# Patient Record
Sex: Female | Born: 1965 | Hispanic: Yes | Marital: Married | Smoking: Never smoker
Health system: Southern US, Community
[De-identification: ages and names within clinical notes are randomized; demographics above are authoritative.]

## PROBLEM LIST (undated history)

## (undated) DIAGNOSIS — R42 Dizziness and giddiness: Secondary | ICD-10-CM

## (undated) DIAGNOSIS — E785 Hyperlipidemia, unspecified: Secondary | ICD-10-CM

## (undated) DIAGNOSIS — G5603 Carpal tunnel syndrome, bilateral upper limbs: Secondary | ICD-10-CM

## (undated) DIAGNOSIS — R739 Hyperglycemia, unspecified: Secondary | ICD-10-CM

## (undated) HISTORY — PX: BREAST EXCISIONAL BIOPSY: SUR124

## (undated) HISTORY — DX: Carpal tunnel syndrome, bilateral upper limbs: G56.03

## (undated) HISTORY — PX: ABDOMINAL HYSTERECTOMY: SHX81

## (undated) HISTORY — DX: Dizziness and giddiness: R42

## (undated) HISTORY — DX: Hyperglycemia, unspecified: R73.9

## (undated) HISTORY — DX: Hyperlipidemia, unspecified: E78.5

## (undated) HISTORY — PX: BREAST CYST EXCISION: SHX579

---

## 2018-02-10 ENCOUNTER — Other Ambulatory Visit: Payer: Self-pay

## 2018-02-14 ENCOUNTER — Other Ambulatory Visit (HOSPITAL_COMMUNITY): Payer: Self-pay | Admitting: *Deleted

## 2018-02-14 DIAGNOSIS — Z1231 Encounter for screening mammogram for malignant neoplasm of breast: Secondary | ICD-10-CM

## 2018-02-17 ENCOUNTER — Other Ambulatory Visit (HOSPITAL_COMMUNITY): Payer: Self-pay | Admitting: *Deleted

## 2018-02-17 DIAGNOSIS — Z0000 Encounter for general adult medical examination without abnormal findings: Secondary | ICD-10-CM

## 2018-02-17 LAB — CYTOLOGY - PAP: Diagnosis: NEGATIVE

## 2018-02-20 NOTE — Addendum Note (Signed)
Addended by: Donia Guiles on: 02/20/2018 10:34 AM   Modules accepted: Orders

## 2018-02-20 NOTE — Addendum Note (Signed)
Addended by: Kristin Lamagna on: 02/20/2018 10:34 AM   Modules accepted: Orders  

## 2018-02-21 ENCOUNTER — Inpatient Hospital Stay: Payer: Self-pay

## 2018-02-21 ENCOUNTER — Inpatient Hospital Stay: Payer: Self-pay | Attending: Obstetrics and Gynecology | Admitting: *Deleted

## 2018-02-21 VITALS — BP 140/74 | Ht 63.0 in | Wt 196.0 lb

## 2018-02-21 DIAGNOSIS — Z0000 Encounter for general adult medical examination without abnormal findings: Secondary | ICD-10-CM | POA: Insufficient documentation

## 2018-02-21 LAB — LIPID PANEL
Cholesterol: 224 mg/dL — ABNORMAL HIGH (ref 0–200)
HDL: 40 mg/dL — ABNORMAL LOW (ref >40)
LDL CALC: 159 mg/dL — AB (ref 0–99)
Total CHOL/HDL Ratio: 5.6 RATIO
Triglycerides: 123 mg/dL (ref <150)
VLDL: 25 mg/dL (ref 0–40)

## 2018-02-21 LAB — HEMOGLOBIN A1C
HEMOGLOBIN A1C: 5.7 % — AB (ref 4.8–5.6)
MEAN PLASMA GLUCOSE: 116.89 mg/dL

## 2018-02-21 NOTE — Progress Notes (Signed)
Wisewoman initial screening  Spanish interpreter- Delorise Royals  Clinical Measurement:  Height: 63in   Weight: 196lb  Blood Pressure: 138/76  Blood Pressure #2: 140/74   Fasting Labs Drawn Today, will review with patient when they result.  Medical History:  Patient states that she has not been diagnosed with high  blood pressure, diabetes or heart disease.  Patient states she has been diagnosed with high cholesterol.  Medications:  Patients states she is not taking any medications for high cholesterol, high blood pressure or diabetes.  She is not taking aspirin daily to prevent heart attack or stroke.    Blood pressure, self measurement:  Patients states she does not measure blood pressure at home.    Nutrition:  Patient states she eats 0.5 cup of fruit and 0.5 cup of vegetables in an average day.  Patient states she does not eat fish regularly, she eats more less than half a serving of whole grains daily. She drinks less than 36 ounces of beverages with added sugar weekly.  She is currently watching her sodium intake.  She has not had any drinks containing alcohol in the last seven days.    Physical activity:  Patient states that she gets 210 minutes of moderate exercise in a week.  She gets 0 minutes of vigorous exercise per week.    Smoking status:  Patient states she has never smoked and is not around any smokers.    Quality of life:  Patient states that she has had 0 bad physical days out of the last 30 days. In the last 2 weeks, she has had 0 days that she has felt down or depressed. She has had 0 days in the last 2 weeks that she has had little interest or pleasure in doing things.  Risk reduction and counseling:  Patient states she wants to lose weight and increase fruit and vegetable intake.  I encouraged her to continue with current exercise regimen and increase vegetable and fruit intake.  Navigation:  I will notify patient of lab results.  Patient is aware of 2 more health coaching  sessions and a follow up.

## 2018-02-25 ENCOUNTER — Ambulatory Visit: Payer: Self-pay

## 2018-02-26 ENCOUNTER — Telehealth (HOSPITAL_COMMUNITY): Payer: Self-pay | Admitting: *Deleted

## 2018-02-26 NOTE — Telephone Encounter (Signed)
Health coaching 2  Spanish Interpreter- Delorise Royals  Labs-cholesterol 224, HDL cholesterol 40, LDL cholesterol 159,triglycerides 123, hemoglobin A1C 5.7, mean plasma 116.89  Patient is aware and understands these lab results.  Goals- Patient states she only eats 0.5 cups each of fruit/vegetables.  I encouraged her to increase consumption of fruits and vegetables to 5 per day. I also encouraged patientt to eat heart healthy fish 2 times per week.  Patient states she exercises 210 minutes weekly.  I encouraged patient to continue this regimen.   Navigation:  Patient is aware of 1 more health coaching sessions and a follow up.  Patient has an appointment at [facility] on November 1 at [facility] for dizziness episodes.  Time- 10 minutes

## 2018-02-28 ENCOUNTER — Ambulatory Visit: Payer: Self-pay

## 2018-02-28 ENCOUNTER — Encounter: Payer: Self-pay | Admitting: General Practice

## 2018-03-26 ENCOUNTER — Telehealth (HOSPITAL_COMMUNITY): Payer: Self-pay | Admitting: *Deleted

## 2018-03-26 NOTE — Telephone Encounter (Signed)
Health Coaching 3   Spanish interpreter- Consuello ClossLucia De Ratmiroff, Language resources   Goals- Patient states that she is eating more fruits and vegetables, at least 1 cup each daily.  Patient states that she is still walking  210 minutes weekly.  I encouraged patient to continue with her exercise and lifestyle changes.    Barrier to reaching goal- N/A  Strategies to overcome barriers- Na/  Navigation: Patient is aware of  a follow up.

## 2018-05-15 ENCOUNTER — Ambulatory Visit
Admission: RE | Admit: 2018-05-15 | Discharge: 2018-05-15 | Disposition: A | Discharge status: Home / Self Care | Payer: No Typology Code available for payment source | Source: Ambulatory Visit | Attending: Obstetrics and Gynecology | Admitting: Obstetrics and Gynecology

## 2018-05-15 ENCOUNTER — Ambulatory Visit (HOSPITAL_COMMUNITY)
Admission: RE | Admit: 2018-05-15 | Discharge: 2018-05-15 | Disposition: A | Discharge status: Home / Self Care | Payer: Self-pay | Source: Ambulatory Visit | Attending: Obstetrics and Gynecology | Admitting: Obstetrics and Gynecology

## 2018-05-15 ENCOUNTER — Encounter (HOSPITAL_COMMUNITY): Payer: Self-pay

## 2018-05-15 ENCOUNTER — Encounter (HOSPITAL_COMMUNITY): Payer: Self-pay | Admitting: *Deleted

## 2018-05-15 VITALS — BP 124/80 | Wt 202.0 lb

## 2018-05-15 DIAGNOSIS — Z1231 Encounter for screening mammogram for malignant neoplasm of breast: Secondary | ICD-10-CM

## 2018-05-15 DIAGNOSIS — Z1239 Encounter for other screening for malignant neoplasm of breast: Secondary | ICD-10-CM

## 2018-05-15 NOTE — Patient Instructions (Signed)
Explained breast self awareness with Davon Maroquin Cavazos. Patient did not need a Pap smear today due to last Pap smear was 02/10/2018 and patient has a history of a hysterectomy for benign reasons. Let patient know that she doesn't need any further Pap smears due to her history of a hysterectomy for benign reasons. Referred patient to the [facility] for a screening mammogram. Appointment scheduled for Thursday, May 15, 2018 at 1240. Patient aware of appointment and will be there. Let patient know the Breast Center will follow up with her within the next couple weeks with results of mammogram by letter or phone. Angelena Maroquin Cavazos verbalized understanding.  Rameen Quinney, Kathaleen Maser, RN 11:54 AM

## 2018-05-15 NOTE — Progress Notes (Signed)
Complaints of bilateral diffuse breast pain x 2-3 years that happens 2-3 times per month. No complaints of breast pain today.   Pap Smear: Pap smear not completed today. Last Pap smear was 02/10/2018 at [facility] and normal. Per patient has no history of an abnormal Pap smear. Patient has a history of a hysterectomy 20 years ago due to AUB. Patient doesn't need any further Pap smears due to her history of a hysterectomy for benign reasons per BCCCP and ACOG guidelines. Last Pap smear result is in Epic.  Physical exam: Breasts Right breast is slightly larger than the left breast that per patient is normal for her. No skin abnormalities bilateral breasts. No nipple retraction bilateral breasts. No nipple discharge bilateral breasts. No lymphadenopathy. No lumps palpated bilateral breasts. No complaints of pain or tenderness on exam. [facility] mamm[facility]. Appointment scheduled for Thursday, May 15, 2018 at 1240.        Pelvic/Bimanual No Pap smear completed today since last Pap smear was 02/10/2018 and patient has a history of a hysterectomy for benign reasons. Pap smear not indicated per BCCCP guidelines.   Smoking History: Patient has never smoked.  Patient Navigation: Patient education provided. Access to services provided for patient through [facility] program. Spanish interpreter provided.   Colorectal Cancer Screening: Per patient has never had a colonoscopy completed. No complaints today. FIT Test given to patient to complete and return to BCCCP.  Breast and Cervical Cancer Risk Assessment: Patient has no family history of breast cancer, known genetic mutations, or radiation treatment to the chest before age 930. Patient has no history of cervical dysplasia, immunocompromised, or DES exposure in-utero.  Risk Assessment    Risk Scores      05/15/2018   Last edited by: Lynnell DikeHolland,  Sabrina H, LPN   5-year risk: 0.7 %   Lifetime risk: 6.7 %          Used Spanish interpreter Natale LayErika McReynolds from [facility].

## 2018-05-19 ENCOUNTER — Other Ambulatory Visit: Payer: Self-pay | Admitting: Obstetrics and Gynecology

## 2018-05-19 DIAGNOSIS — R928 Other abnormal and inconclusive findings on diagnostic imaging of breast: Secondary | ICD-10-CM

## 2018-05-21 ENCOUNTER — Other Ambulatory Visit: Payer: Self-pay

## 2018-05-21 ENCOUNTER — Ambulatory Visit: Payer: No Typology Code available for payment source

## 2018-05-21 ENCOUNTER — Ambulatory Visit
Admission: RE | Admit: 2018-05-21 | Discharge: 2018-05-21 | Disposition: A | Discharge status: Home / Self Care | Payer: No Typology Code available for payment source | Source: Ambulatory Visit | Attending: Obstetrics and Gynecology | Admitting: Obstetrics and Gynecology

## 2018-05-21 ENCOUNTER — Other Ambulatory Visit: Payer: Self-pay | Admitting: Obstetrics and Gynecology

## 2018-05-21 DIAGNOSIS — R928 Other abnormal and inconclusive findings on diagnostic imaging of breast: Secondary | ICD-10-CM

## 2018-05-21 DIAGNOSIS — R921 Mammographic calcification found on diagnostic imaging of breast: Secondary | ICD-10-CM

## 2018-05-28 ENCOUNTER — Encounter (HOSPITAL_COMMUNITY): Payer: Self-pay

## 2018-05-28 LAB — FECAL OCCULT BLOOD, IMMUNOCHEMICAL: Fecal Occult Bld: NEGATIVE

## 2018-11-10 ENCOUNTER — Inpatient Hospital Stay: Payer: Self-pay | Attending: Obstetrics and Gynecology | Admitting: *Deleted

## 2018-11-10 ENCOUNTER — Other Ambulatory Visit: Payer: Self-pay

## 2018-11-10 VITALS — BP 132/86 | Temp 97.1°F | Ht 62.25 in | Wt 198.0 lb

## 2018-11-10 DIAGNOSIS — Z0000 Encounter for general adult medical examination without abnormal findings: Secondary | ICD-10-CM

## 2018-11-10 NOTE — Progress Notes (Signed)
Wisewoman follow up  Interpreter: Rudene Anda, [employer]   Clinical Measurement:  Height: 62 1/4 in Weight: 198 lb  Blood Pressure: 138/84  Blood Pressure #2: 132/86    Medical History:  Patient states that she has high cholesterol. Patient states that she has no history of high blood pressure or diabetes.   Medications: Patient states that she is not currently taking any medications to lower cholesterol, blood pressure or blood sugar. Patient does not take an aspirin daily to prevent heart attack or stroke.  Blood pressure, self measurement:  Patient states that a doctor has never told her to measure blood pressure from home.  Nutrition: Patient states that on an average day she consumes 2 cups of fruit and 1 cup of vegetables. Patient states that she does not eat fish at least 2 times a week. Patient states that less than half of all the servings of grain products consumed on a typical day are whole grains. She drinks less than 36 ounces of beverages with added sugars weekly and is not currently watching or reducing sodium or salt intake. In the past 7 days patient has not had any drinks containing alcohol and on an average does not consume any drinks containing alcohol.    Physical activity: Patient states that she gets 140 minutes of moderate physical activity and 0 minutes of vigorous physical activity in a week.  Smoking status:  Patient states that she quit smoking tobacco over 12 months.    Quality of life:  Over the past 2 weeks patient states that she has not had any days where she has had little interest or pleasure in doing things and no days where she is feeling down, depressed or hopeless.    Health Coaching: Educated patient about diet and exercise. Encouraged patient to try and consume and extra 2 servings of vegetables a day to get the recommended 3 servings. Also spoke with patient about adding 2 servings of fish per week into diet. Emphasized that salmon, mackerel, and tuna  were good heart healthy choices. Also spoke with the patient about reducing the amounts of fried foods consumed and suggested the alternative of grilling or baking. Patient is also not currently watching the amount of salt consumed, educated the patient about reducing or eliminating salt in her diet to help with blood pressure. Also used food models to show patient different alternatives to consume more whole grains in diet. Encouraged patient to participate in re-screening in October since some of her initial labs were elevated as well as blood pressure.   Navigation: This was the  follow up session for this patient, I will check up on her progress in the coming months.  Time: 30 minutes

## 2018-11-20 ENCOUNTER — Ambulatory Visit
Admission: RE | Admit: 2018-11-20 | Discharge: 2018-11-20 | Disposition: A | Discharge status: Home / Self Care | Payer: No Typology Code available for payment source | Source: Ambulatory Visit | Attending: Obstetrics and Gynecology | Admitting: Obstetrics and Gynecology

## 2018-11-20 ENCOUNTER — Other Ambulatory Visit: Payer: Self-pay | Admitting: Obstetrics and Gynecology

## 2018-11-20 ENCOUNTER — Other Ambulatory Visit: Payer: Self-pay

## 2018-11-20 DIAGNOSIS — R921 Mammographic calcification found on diagnostic imaging of breast: Secondary | ICD-10-CM

## 2019-03-02 ENCOUNTER — Other Ambulatory Visit: Payer: Self-pay

## 2019-03-02 ENCOUNTER — Inpatient Hospital Stay: Payer: Self-pay | Attending: Obstetrics and Gynecology | Admitting: *Deleted

## 2019-03-02 ENCOUNTER — Other Ambulatory Visit: Payer: Self-pay | Admitting: Obstetrics and Gynecology

## 2019-03-02 VITALS — BP 117/68 | Temp 98.0°F | Ht 62.5 in | Wt 198.0 lb

## 2019-03-02 DIAGNOSIS — Z0000 Encounter for general adult medical examination without abnormal findings: Secondary | ICD-10-CM

## 2019-03-02 NOTE — Progress Notes (Signed)
Wisewoman initial screening   interpreter- Allena Earing, Patient's Daughter   Clinical Measurement:  Height: 62.5 in Weight: 198 lb  Blood Pressure: 128/71  Blood Pressure #2: 117/68 Fasting Labs Drawn Today, will review with patient when they result.   Medical History:  Patient states that she does not know if she has high cholesterol or high blood pressure. Patient states that she does not have a history of diabetes.  Medications:  Patient states that she does not take medication to lower cholesterol, blood pressure or blood sugar. Patient does not take an aspirin a day to help prevent a heart attack or stroke.    Blood pressure, self measurement: Patient states that she does not measure blood pressure from home and has not been told to do so by a healthcare provider.   Nutrition: Patient states that on average she eats 1 cup of fruit and 0 cups of vegetables per day. Patient states that she does eat fish at least 2 times per week. Patient eats less than half servings of whole grains. Patient drinks less than 36 ounces of beverages with added sugar weekly. Patient is not currently watching sodium or salt intake. In the past 7 days patient has not had any drinks containing alcohol. On average patient does not drink any drinks containing alcohol.      Physical activity:  Patient states that she gets 0 minutes of moderate and 0 minutes of vigorous physical activity each week.  Smoking status:  Patient states that she has never smoked tobacco.   Quality of life:  Over the past 2 weeks patient states that she has not had any days where she has little interest or pleasure in doing things and 0 days where she has felt down, depressed or hopeless.    Risk reduction and counseling:    Health Coaching: Encouraged patient to increase daily amount of fruits and vegetables. Explained that the recommendation is for 2 cups of fruit and 2 cups of vegetables daily. Also spoke with patient about adding more  whole grains into diet. Gave suggestions of brown rice, whole wheat bread, oatmeal or whole grain cereals. Also encouraged patient to start walking for at least 20 minutes per day.   Navigation:  I will notify patient of lab results.  Patient is aware of 2 more health coaching sessions and a follow up.  Time: 20 minutes

## 2019-03-03 ENCOUNTER — Other Ambulatory Visit (HOSPITAL_COMMUNITY): Payer: Self-pay | Admitting: *Deleted

## 2019-03-03 DIAGNOSIS — R921 Mammographic calcification found on diagnostic imaging of breast: Secondary | ICD-10-CM

## 2019-03-03 LAB — LIPID PANEL W/O CHOL/HDL RATIO
Cholesterol, Total: 229 mg/dL — ABNORMAL HIGH (ref 100–199)
HDL: 44 mg/dL (ref >39)
LDL Chol Calc (NIH): 156 mg/dL — ABNORMAL HIGH (ref 0–99)
Triglycerides: 161 mg/dL — ABNORMAL HIGH (ref 0–149)
VLDL Cholesterol Cal: 29 mg/dL (ref 5–40)

## 2019-03-03 LAB — HGB A1C W/O EAG: Hgb A1c MFr Bld: 6 % — ABNORMAL HIGH (ref 4.8–5.6)

## 2019-03-03 LAB — GLUCOSE, RANDOM: Glucose: 90 mg/dL (ref 65–99)

## 2019-03-09 ENCOUNTER — Telehealth: Payer: Self-pay

## 2019-03-09 ENCOUNTER — Ambulatory Visit: Payer: No Typology Code available for payment source

## 2019-03-09 NOTE — Telephone Encounter (Signed)
Health coaching 2   interpreter- Rudene Anda, [place]- 229 cholesterol , 156 LDL cholesterol , 161 triglycerides , 44 HDL cholesterol , 6.0 hemoglobin A1C , 90 mean plasma glucose  Patient understands and is aware of her lab results.   Goals-  Discussed lab results with patient. Answered any questions patient had regarding results.   Health Coaching: Goals- Work on reducing the amount of fried and fatty foods consumed. Reduce the amount of red meat and full fat dairy foods. Add more healthy fats into diet like olive oil, avocados, and heart healthy nuts. Increase the amount of whole grains in diet. Work on reducing the amount of sweets consumed as well as carbs.   Navigation:  Patient is aware of 1 more health coaching sessions and a follow up. Patient is scheduled for follow-up with Internal Medicine on Thursday, November 19th @ 3:15 pm  Time- 20 minutes

## 2019-03-19 ENCOUNTER — Encounter: Payer: Self-pay | Admitting: Internal Medicine

## 2019-03-19 ENCOUNTER — Ambulatory Visit (INDEPENDENT_AMBULATORY_CARE_PROVIDER_SITE_OTHER): Payer: Self-pay | Admitting: Internal Medicine

## 2019-03-19 DIAGNOSIS — R739 Hyperglycemia, unspecified: Secondary | ICD-10-CM

## 2019-03-19 DIAGNOSIS — Z23 Encounter for immunization: Secondary | ICD-10-CM

## 2019-03-19 DIAGNOSIS — G5603 Carpal tunnel syndrome, bilateral upper limbs: Secondary | ICD-10-CM

## 2019-03-19 DIAGNOSIS — E785 Hyperlipidemia, unspecified: Secondary | ICD-10-CM

## 2019-03-19 NOTE — Patient Instructions (Signed)
Chelsea Morton,   It was nice to meet you! Today we discussed your blood sugar and cholesterol. Exercising and dieting will help bring your cholesterol and blood sugar down. We also spoke about your wrist numbness today. After your physical exam, it appears that you have carpal tunnel syndrome. Please buy a left and right wrist splint at your nearest drug store. Just make sure it is a wrist splint. Try to wear them as often as you can, but please wear them every night. Please schedule a meeting with our financial advisor for the orange card as well. We look forward to seeing you again. Please call our office if you have any concerns. Happy Holidays! Sincerely,  Maudie Mercury

## 2019-03-19 NOTE — Progress Notes (Signed)
   CC: Bilateral hand numbness/HLD/Elevated Sugar  HPI:  [name] is a 53 y.o. , with a medical history noted below, comes to the clinic due to elevated blood sugars, elevated cholesterol, and bilateral   Past Medical History:  Diagnosis Date  . Carpal tunnel syndrome, bilateral   . Elevated blood sugar level   . Hyperlipidemia    Review of Systems:   Review of Systems  Constitutional: Negative for chills, fever, malaise/fatigue and weight loss.  HENT: Negative for hearing loss.   Eyes: Negative for blurred vision, double vision and photophobia.  Respiratory: Negative for cough, shortness of breath and wheezing.   Cardiovascular: Negative for chest pain and palpitations.  Gastrointestinal: Negative for abdominal pain, blood in stool, constipation, diarrhea, nausea and vomiting.  Genitourinary: Negative for dysuria and urgency.  Musculoskeletal: Negative for myalgias.  Neurological: Positive for tingling. Negative for dizziness, weakness and headaches.       Patient endorses bilateral hand tingling and numbness, appears to be more frequent at night.     Physical Exam:  Vitals:   03/19/19 1514  BP: 118/80  Pulse: 70  Temp: 98.1 F (36.7 C)  TempSrc: Oral  SpO2: 100%  Weight: 197 lb 14.4 oz (89.8 kg)   Physical Exam Vitals signs and nursing note reviewed.  Constitutional:      General: She is not in acute distress.    Appearance: Normal appearance. She is not ill-appearing or toxic-appearing.  HENT:     Head: Normocephalic and atraumatic.  Cardiovascular:     Rate and Rhythm: Normal rate and regular rhythm.     Pulses: Normal pulses.     Heart sounds: Normal heart sounds. No murmur. No friction rub. No gallop.   Pulmonary:     Effort: Pulmonary effort is normal.     Breath sounds: Normal breath sounds. No wheezing, rhonchi or rales.  Abdominal:     General: Abdomen is flat. Bowel sounds are normal.     Palpations: Abdomen is soft.   Tenderness: There is no abdominal tenderness. There is no guarding.  Musculoskeletal: Normal range of motion.        General: No swelling or tenderness.     Right lower leg: No edema.     Left lower leg: No edema.  Skin:    General: Skin is warm.     Findings: No erythema or rash.  Neurological:     Mental Status: She is alert and oriented to person, place, and time.     Comments: Patient has a + Phalen's and Tinel's sign bilaterally in the upper R and L extremities.  Psychiatric:        Mood and Affect: Mood normal.        Behavior: Behavior normal.     Assessment & Plan:   See Encounters Tab for problem based charting.  Patient seen with Dr. Dareen Piano

## 2019-03-20 ENCOUNTER — Encounter: Payer: Self-pay | Admitting: Internal Medicine

## 2019-03-20 DIAGNOSIS — Z0000 Encounter for general adult medical examination without abnormal findings: Secondary | ICD-10-CM | POA: Insufficient documentation

## 2019-03-20 DIAGNOSIS — E785 Hyperlipidemia, unspecified: Secondary | ICD-10-CM | POA: Insufficient documentation

## 2019-03-20 DIAGNOSIS — G5603 Carpal tunnel syndrome, bilateral upper limbs: Secondary | ICD-10-CM | POA: Insufficient documentation

## 2019-03-20 DIAGNOSIS — R739 Hyperglycemia, unspecified: Secondary | ICD-10-CM | POA: Insufficient documentation

## 2019-03-20 NOTE — Assessment & Plan Note (Signed)
HPI:  Patient presents to the office with bilateral hand numbness and tingling sensation. Patient states that this pain has been ongoing for years. She states that the numbness is aggravated by working hard with her hands, holding her grandchild, and at night. Nothing alleviates her symptoms. Patient does not associate other symptoms with her numbness and tingling, with the exception that sometimes it begins in the forearms and goes down through her wrists.   Assessment:  Patient comes to the office with bilateral hand numbness and tingling. Provacative testing, Tinel's and Phalen's, were positive bilaterally, with no indication for muscle weakness or atrophy. Appears to be mild carpal tunnel syndrome. No indication for surgical intervention at this time. Will treat conservatively and reevaluate the need for further workup at next appointment.   Plan:  -Patient instructed to purchase bilateral wrist splints at their nearest  Pharmacy. Patient instructed to wear splints as much as she can, but should wear them every night to best treat her symptoms.  - Reassess at next visit.

## 2019-03-20 NOTE — Assessment & Plan Note (Signed)
Patient referred to the clinic after receiving a lipid panel with an elevated LDL of 156. Patient does endorse changing her diet after speaking with her previous health care provider. She states that she has not been exercising due to taking care of her grandchild. We spoke about the long term ramifications that can occur if cholesterol is left unchecked, including CVD. Her ASCVD Risk score is 1.8%. Patient agreed to diet modification and starting to exercise.   Plan:  - follow up in 3-6 months with another Lipid Panel and to assess patient's lifestyle modifications.

## 2019-03-20 NOTE — Assessment & Plan Note (Addendum)
Patient referred to the clinic after having an A1c of 6.0 with a random CBG of 90. Patient states that she has not been exercising actively, since she is watching her grandchild daily. She states that she is beginning to modify her diet after speaking with her prior health care provider. Patient was encouraged to continue modifying her diet and exercising. We discussed the ramifications of lifestyle changes have on blood sugars, and the adverse effects that could happen if blood sugars remain uncontrolled.  Plan: - Patient to continue modifying lifestyle. - reassess HbA1c in three-six month's time.

## 2019-03-21 NOTE — Progress Notes (Signed)
Internal Medicine Clinic Attending  I saw and evaluated the patient.  I personally confirmed the key portions of the history and exam documented by Dr. Winters and I reviewed pertinent patient test results.  The assessment, diagnosis, and plan were formulated together and I agree with the documentation in the resident's note.  

## 2019-05-26 ENCOUNTER — Ambulatory Visit (HOSPITAL_COMMUNITY)
Admission: RE | Admit: 2019-05-26 | Discharge: 2019-05-26 | Disposition: A | Discharge status: Home / Self Care | Payer: Self-pay | Source: Ambulatory Visit | Attending: Obstetrics and Gynecology | Admitting: Obstetrics and Gynecology

## 2019-05-26 ENCOUNTER — Encounter (HOSPITAL_COMMUNITY): Payer: Self-pay

## 2019-05-26 ENCOUNTER — Other Ambulatory Visit: Payer: Self-pay

## 2019-05-26 DIAGNOSIS — Z1239 Encounter for other screening for malignant neoplasm of breast: Secondary | ICD-10-CM | POA: Insufficient documentation

## 2019-05-26 NOTE — Progress Notes (Signed)
Patient referred to [facility] by the [facility] due to recommending 79-month bilateral breast diagnostic mammogram follow-up. Last left breast diagnostic mammogram completed 11/20/2018.   Pap Smear: Pap smear not completed today. Last Pap smear was 02/10/2018 at the free cervical cancer screening at the [facility] and normal. Per patient has no history of an abnormal Pap smear. Patient has a history of a hysterectomy 21 years ago due to AUB. Patient doesn't need any further Pap smears due to her history of a hysterectomy for benign reasons per BCCCP and ASCCP guidelines. Last Pap smear result is in Epic.  Physical exam: Breasts Right breast is slightly larger than the left breast that per patient is normal for her. No skin abnormalities bilateral breasts. No nipple retraction bilateral breasts. No nipple discharge bilateral breasts. No lymphadenopathy. No lumps palpated bilateral breasts. No complaints of pain or tenderness on exam. Referred patient to the [facility] of [facility] for a bilateral diagnostic mammogram per recommendation. Appointment scheduled for Friday, May 29, 2019 at 1500.        Pelvic/Bimanual No Pap smear completed today since last Pap smear was 02/10/2018 and patient has a history of a hysterectomy for benign reasons. Pap smear not indicated per BCCCP guidelines.   Smoking History: Patient has never smoked.  Patient Navigation: Patient education provided. Access to services provided for patient through [facility] program. Spanish interpreter provided.   Colorectal Cancer Screening: Per patient has never had a colonoscopy completed. FIT Test completed 05/21/2018 negative. No complaints today.   Breast and Cervical Cancer Risk Assessment: Patient has no family history of breast cancer, known genetic mutations, or radiation treatment to the chest before age 16. Patient has no history of cervical dysplasia, immunocompromised, or DES exposure  in-utero.  Risk Assessment    Risk Scores      05/26/2019 05/15/2018   Last edited by: Narda Rutherford, LPN Stoney Bang H, LPN   5-year risk: 0.8 % 0.7 %   Lifetime risk: 6.6 % 6.7 %

## 2019-05-26 NOTE — Patient Instructions (Signed)
Explained breast self awareness with Chelsea Morton. Patient did not need a Pap smear today due to last Pap smear was 02/10/2018 and patient has a history of a hysterectomy for benign reasons. Let patient know that she doesn't need any further Pap smears due to her history of a hysterectomy for benign reasons. Referred patient to the [facility] for a bilateral diagnostic mammogram per recommendation. Appointment scheduled for Friday, May 29, 2019 at 1500. Patient aware of appointment and will be there. Chelsea Morton verbalized understanding.  Padraig Nhan, Kathaleen Maser, RN 3:30 PM

## 2019-05-27 ENCOUNTER — Other Ambulatory Visit: Payer: Self-pay

## 2019-05-29 ENCOUNTER — Ambulatory Visit
Admission: RE | Admit: 2019-05-29 | Discharge: 2019-05-29 | Disposition: A | Discharge status: Home / Self Care | Payer: No Typology Code available for payment source | Source: Ambulatory Visit | Attending: Obstetrics and Gynecology | Admitting: Obstetrics and Gynecology

## 2019-05-29 ENCOUNTER — Other Ambulatory Visit: Payer: Self-pay

## 2019-05-29 ENCOUNTER — Ambulatory Visit: Payer: Self-pay

## 2019-05-29 DIAGNOSIS — R921 Mammographic calcification found on diagnostic imaging of breast: Secondary | ICD-10-CM

## 2019-09-08 ENCOUNTER — Telehealth: Payer: Self-pay

## 2019-09-08 NOTE — Telephone Encounter (Signed)
Health Coaching 3  Interpreter- Natale Lay, [employer]   Goals-    New goal-   Barrier to reaching goal-    Strategies to overcome-    Navigation:  Patient is aware of  a follow up session. Patient is scheduled for follow-up on Monday, October 12, 2019 @ 3:15 pm.   Time- 10 minutes

## 2019-09-08 NOTE — Telephone Encounter (Signed)
Health Coaching 3  interpreter- Rudene Anda, [facility]   Goals- Patient stated that she has not been able to make many changes with her diet and exercise over the past few months.    New goal- Spoke with the patient about trying to pick back up with the goals that we had originally set. Avoid fried and fatty foods. Add in more whole grains and heart healthy fish into diet. Reduce the amount of sweets and sugars and carbs in diet.  Barrier to reaching goal- Patient being ready to make these lifestyle changes.    Strategies to overcome- Encouraged patient and coached patient that she can make these changes if she is ready and willing to do so. Also spoke with patient about working on one goal at a time and then once she feels like she has met that goal to pick another one to start so she does not feel overwhelmed with making too many changes at once.    Navigation:  Patient is aware of  a follow up session. Patient is scheduled for follow-up visit on Monday, June 14th @ 3:15 pm.   Time- 10 minutes

## 2019-10-12 ENCOUNTER — Inpatient Hospital Stay: Payer: Self-pay | Attending: Obstetrics and Gynecology | Admitting: *Deleted

## 2019-10-12 ENCOUNTER — Other Ambulatory Visit: Payer: Self-pay

## 2019-10-12 VITALS — BP 112/82 | Ht 62.5 in | Wt 206.4 lb

## 2019-10-12 DIAGNOSIS — Z0000 Encounter for general adult medical examination without abnormal findings: Secondary | ICD-10-CM

## 2019-10-13 NOTE — Progress Notes (Signed)
Wisewoman follow up   Interpreter: Natale Lay, Haroldine Laws  Clinical Measurement:   Vitals:   10/12/19 1519 10/13/19 1026  BP: 112/82 112/82      Medical History:  Patient states that she has high cholesterol, does not have high blood pressure and she does not have diabetes.  Medications:  Patient states that she does not take medication to lower cholesterol, blood pressure or blood sugar.  Patient does not take an aspirin a day to help prevent a heart attack or stroke.    Blood pressure, self measurement:  :  Patient states that she does not measure blood pressure from home. She checks her blood pressure N/A. She shares her readings with a health care provider: N/A.   Nutrition: Patient states that on average she eats 2 cups of fruit and 0 cups of vegetables per day. Patient states that she does not eat fish at least 2 times per week. Patient eats less than half servings of whole grains. Patient drinks less than 36 ounces of beverages with added sugar weekly: yes. Patient is currently watching sodium or salt intake: no. In the past 7 days patient has had 0 drinks containing alcohol. On average patient drinks 0 drinks containing alcohol per day.      Physical activity:  Patient states that she gets 0 minutes of moderate and 0 minutes of vigorous physical activity each week.  Smoking status:  Patient states that she has has never smoked .   Quality of life:  Over the past 2 weeks patient states that she had little interest or pleasure in doing things: not at all. She has been feeling down, depressed or hopeless:not at all.    Risk reduction and counseling:   Health Coaching: Spoke with patient about adding more vegetables into daily diet. Patient stated that she doesn't usually eat vegetables very often. Explained that the daily recommendation is for 3 cups a day. Encouraged her to try and add in at least 1 serving a day. We all discussed the importance of heart healthy fish and whole grains  in regards to heart health. Encouraged patient to add more of these foods in her diet since her cholesterol levels were elevated during initial visit. Patient also stated that she probably adds to much salt to her food when she cooks. Encouraged her to try and be more mindful in regards to her sodium intake. We also talked about watching the amount of sugar and carbs that she consumes since her hemoglobin A1C was elevated during her initial visit. Finally we discussed daily exercise. Encouraged her to try and start walking for 20 minutes a day.   Navigation: This was the  follow up session for this patient, I will check up on her progress in the coming months.  Time: 20 minutes

## 2020-01-05 ENCOUNTER — Other Ambulatory Visit: Payer: Self-pay

## 2020-01-05 DIAGNOSIS — Z0000 Encounter for general adult medical examination without abnormal findings: Secondary | ICD-10-CM

## 2020-03-09 ENCOUNTER — Other Ambulatory Visit: Payer: Self-pay

## 2020-03-09 ENCOUNTER — Inpatient Hospital Stay: Payer: No Typology Code available for payment source | Attending: Obstetrics and Gynecology | Admitting: *Deleted

## 2020-03-09 VITALS — BP 112/72 | Ht 62.0 in | Wt 201.7 lb

## 2020-03-09 DIAGNOSIS — Z0000 Encounter for general adult medical examination without abnormal findings: Secondary | ICD-10-CM

## 2020-03-09 NOTE — Progress Notes (Signed)
Wisewoman initial screening   Interpreter- Natale Lay, [place]   Clinical Measurement:  Vitals:   03/09/20 0907 03/09/20 0924  BP: 118/74 112/72   Fasting Labs Drawn Today, will review with patient when they result.   Medical History:  Patient states that she has high cholesterol, does not have high blood pressure and she does not have diabetes.  Medications:  Patient states that she does not take medication to lower cholesterol, blood pressure and blood sugar.  Patient does not take an aspirin a day to help prevent a heart attack or stroke.    Blood pressure, self measurement: Patient states that she does not measure blood pressure from home. She checks her blood pressure N/A. She shares her readings with a health care provider: N/A.   Nutrition: Patient states that on average she eats 1 cups of fruit and 2 cups of vegetables per day. Patient states that she does not eat fish at least 2 times per week. Patient eats less than half servings of whole grains. Patient drinks less than 36 ounces of beverages with added sugar weekly: yes. Patient is currently watching sodium or salt intake: no. In the past 7 days patient has had 0 drinks containing alcohol. On average patient drinks 0 drinks containing alcohol per day.      Physical activity:  Patient states that she gets 0 minutes of moderate and 0 minutes of vigorous physical activity each week.  Smoking status:  Patient states that she has has never smoked .   Quality of life:  Over the past 2 weeks patient states that she had little interest or pleasure in doing things: not at all. She has been feeling down, depressed or hopeless:not at all.    Risk reduction and counseling:   Health Coaching: Spoke with patient about the daily recommendation for  fruits and vegetables. Explained that the recommendation is for 2 cups of fruit and 3 cups of vegetables per day. Patient currently eats fish 1 time per week. Explained that the recommendation is  for 2 servings of fish weekly. Gave suggestions for heart healthy options she can try like salmon, tuna, mackerel, sardines or sea bass. Also spoke with patient about the importance of whole grains. Gave examples of whole wheat bread or pasta, whole grain cereals, brown rice or oatmeal. Encouraged patient to also try and start watching the amount of salt that she cooks with and adds to her food. Patient is not currently exercises. Encouraged patient to try and start exercising for 20 minutes daily.    Navigation:  I will notify patient of lab results.  Patient is aware of 2 more health coaching sessions and a follow up.  Time: 25 minutes

## 2020-03-10 LAB — LIPID PANEL
Chol/HDL Ratio: 5.4 ratio — ABNORMAL HIGH (ref 0.0–4.4)
Cholesterol, Total: 214 mg/dL — ABNORMAL HIGH (ref 100–199)
HDL: 40 mg/dL (ref >39)
LDL Chol Calc (NIH): 150 mg/dL — ABNORMAL HIGH (ref 0–99)
Triglycerides: 134 mg/dL (ref 0–149)
VLDL Cholesterol Cal: 24 mg/dL (ref 5–40)

## 2020-03-10 LAB — HEMOGLOBIN A1C
Est. average glucose Bld gHb Est-mCnc: 128 mg/dL
Hgb A1c MFr Bld: 6.1 % — ABNORMAL HIGH (ref 4.8–5.6)

## 2020-03-10 LAB — GLUCOSE, RANDOM: Glucose: 88 mg/dL (ref 65–99)

## 2020-03-14 ENCOUNTER — Telehealth: Payer: Self-pay

## 2020-03-14 NOTE — Telephone Encounter (Signed)
Health coaching 2   interpreter- Natale Lay, [employer]   Labs- 214 cholesterol, 150 LDL cholesterol, 134 triglycerides, 40 HDL cholesterol, 6.1 hemoglobin A1C , mean plasma glucose. Patient understands and is aware of her lab results.   Goals-   1. Reduce the amount of fried and fatty foods consumed. 2. Continue with heart healthy diet of heart healthy fish and whole grains. Gave examples of each for patient to try to incorporate into diet. 3. Reduce the amount of red meats consumed try and substitute for leaner proteins like chicken and fish. 4. Reduce the amount of sweets and sugars consumed in both food and drink. 5. Reduce the amount of carbs consumed. Explained what the daily recommendation is for grains and carbs. Gave patient examples of what 1 oz would look like for different grains. 6. Exercise daily for at least 20 minutes.    Navigation:  Patient is aware of 1 more health coaching sessions and a follow up. Patient is scheduled for follow-up visit with Internal Medicine on Tuesday, March 22, 2020 @ 9:45 am.  Time- 10 minutes

## 2020-03-22 ENCOUNTER — Encounter: Payer: No Typology Code available for payment source | Admitting: Student

## 2020-03-28 ENCOUNTER — Encounter: Payer: No Typology Code available for payment source | Admitting: Student

## 2020-03-29 ENCOUNTER — Other Ambulatory Visit: Payer: Self-pay

## 2020-03-29 DIAGNOSIS — R921 Mammographic calcification found on diagnostic imaging of breast: Secondary | ICD-10-CM

## 2020-06-02 ENCOUNTER — Ambulatory Visit: Payer: Self-pay | Admitting: *Deleted

## 2020-06-02 ENCOUNTER — Ambulatory Visit
Admission: RE | Admit: 2020-06-02 | Discharge: 2020-06-02 | Disposition: A | Discharge status: Home / Self Care | Payer: No Typology Code available for payment source | Source: Ambulatory Visit | Attending: Obstetrics and Gynecology | Admitting: Obstetrics and Gynecology

## 2020-06-02 ENCOUNTER — Encounter (INDEPENDENT_AMBULATORY_CARE_PROVIDER_SITE_OTHER): Payer: Self-pay

## 2020-06-02 ENCOUNTER — Ambulatory Visit: Payer: Self-pay

## 2020-06-02 ENCOUNTER — Other Ambulatory Visit: Payer: Self-pay

## 2020-06-02 VITALS — BP 132/92 | Wt 196.4 lb

## 2020-06-02 DIAGNOSIS — R921 Mammographic calcification found on diagnostic imaging of breast: Secondary | ICD-10-CM

## 2020-06-02 DIAGNOSIS — Z1239 Encounter for other screening for malignant neoplasm of breast: Secondary | ICD-10-CM

## 2020-06-02 NOTE — Patient Instructions (Addendum)
Explained breast self awareness with Chelsea Morton. Patient did not need a Pap smear today due to last Pap smear was 02/10/2018 and a history of hysterectomy for benign reasons. Let patient know that she doesn't need any further Pap smears due to her history of a hysterectomy for benign reasons. Referred patient to the [facility] of [facility] for a diagnostic mammogram per recommendation. Appointment scheduled Thursday, June 02, 2020 at 1400. Patient aware of appointment and will be there. Chelsea Morton verbalized understanding.  Angelina Venard, Kathaleen Maser, RN 1:05 PM

## 2020-06-02 NOTE — Progress Notes (Signed)
Ms. Chelsea Morton is a 55 y.o. female who presents to [facility] clinic today with complaint of left breast pain that comes and goes. Patient rates the pain at a 6-7 out of 10. Last bilateral diagnostic mammogram was completed 05/29/2019 that was probably benign with bilateral diagnostic mammogram recommended in 12 months for follow-up.   Pap Smear: Pap smear not completed today. [facility]Per patient has no history of an abnormal Pap smear.Patient has a history of a hysterectomy 21 years ago due to AUB and per patient possibly uterine cancer. Patient doesn't need any further Pap smears due to her history of a hysterectomy for benign reasons per BCCCP and ASCCP guidelines.Last Pap smear result isin Epic.   Physical exam: Breasts Right breast is slightly larger than the left breast that per patient is normal for her.No skin abnormalities bilateral breasts. No nipple retraction bilateral breasts. No nipple discharge bilateral breasts. No lymphadenopathy. No lumps palpated bilateral breasts. No complaints of pain or tenderness on exam.      Pelvic/Bimanual Pap is not indicated today per BCCCP guidelines.    Smoking History: Patient has never smoked.   Patient Navigation: Patient education provided. Access to services provided for patient through [place] program. Spanish interpreter Natale Lay from [facility] provided.   Colorectal Cancer Screening: Per patient has never had a colonoscopy completed. FIT Test completed 05/21/2018 negative. No complaints today.    Breast and Cervical Cancer Risk Assessment: Patient does not have family history of breast cancer, known genetic mutations, or radiation treatment to the chest before age 48. Patient does not have history of cervical dysplasia, immunocompromised, or DES exposure in-utero.  Risk Assessment    Risk Scores      06/02/2020 05/26/2019    Last edited by: Meryl Dare, CMA [place], Sherie Demetrius Charity, LPN   5-year risk: 0.8 % 0.8 %   Lifetime risk: 6.4 % 6.6 %          A: BCCCP exam without pap smear Complaint of left breast pain.  P: [facility] for a diagnostic mamm[facility] per recommendation. Appointment scheduled Thursday, June 02, 2020 at 1400.  Priscille Heidelberg, RN 06/02/2020 1:05 PM

## 2020-06-06 ENCOUNTER — Telehealth: Payer: Self-pay | Admitting: Internal Medicine

## 2020-06-06 NOTE — Telephone Encounter (Signed)
Per spanish interpreter jose ID [phone] Pt has had pfizer vaccine 1st dose 08-07-2019, 2nd dose 08-31-2019 and pfizer booster on 04-11-2020

## 2021-05-31 ENCOUNTER — Encounter: Payer: Self-pay | Admitting: Internal Medicine

## 2021-05-31 ENCOUNTER — Other Ambulatory Visit: Payer: Self-pay

## 2021-05-31 DIAGNOSIS — Z1231 Encounter for screening mammogram for malignant neoplasm of breast: Secondary | ICD-10-CM

## 2021-08-10 ENCOUNTER — Ambulatory Visit
Admission: RE | Admit: 2021-08-10 | Discharge: 2021-08-10 | Disposition: A | Discharge status: Home / Self Care | Payer: No Typology Code available for payment source | Source: Ambulatory Visit | Attending: Obstetrics and Gynecology | Admitting: Obstetrics and Gynecology

## 2021-08-10 ENCOUNTER — Ambulatory Visit: Payer: Self-pay | Admitting: *Deleted

## 2021-08-10 VITALS — BP 126/84 | Wt 205.8 lb

## 2021-08-10 DIAGNOSIS — Z1231 Encounter for screening mammogram for malignant neoplasm of breast: Secondary | ICD-10-CM

## 2021-08-10 DIAGNOSIS — Z1211 Encounter for screening for malignant neoplasm of colon: Secondary | ICD-10-CM

## 2021-08-10 DIAGNOSIS — Z1239 Encounter for other screening for malignant neoplasm of breast: Secondary | ICD-10-CM

## 2021-08-10 NOTE — Patient Instructions (Signed)
Explained breast self awareness with Chelsea Morton. Patient did not need a Pap smear today due to patient has a history of a hysterectomy for benign reasons. Let patient know that she doesn't need any further Pap smears due to her history of a hysterectomy for benign reasons. Referred patient to the [facility] for a screening mammogram on mobile unit. Appointment scheduled Thursday, August 10, 2021 at 1120. Patient aware of appointment and will be there. Let patient know the Breast Center will follow up with her within the next couple weeks with results of her mammogram by letter or phone. Chelsea Morton verbalized understanding. ? ?Ryman Rathgeber, Kathaleen Maser, RN ?10:43 AM ? ? ? ? ?

## 2021-08-10 NOTE — Progress Notes (Signed)
Chelsea Morton is a 56 y.o. female who presents to [facility] clinic today with no complaints.  ?  ?Pap Smear: Pap smear not completed today. Last Pap smear was 02/10/2018 at the free cervical cancer screening at the [facility] and normal. Per patient has no history of an abnormal Pap smear. Patient has a history of a hysterectomy 21 years ago due to AUB and per patient possibly uterine cancer. Patient doesn't need any further Pap smears due to her history of a hysterectomy for benign reasons per BCCCP and ASCCP guidelines. Last Pap smear result is in Epic. ?  ?Physical exam: ?Breasts ?Right breast is slightly larger than the left breast that per patient is normal for her. No skin abnormalities bilateral breasts. No nipple retraction bilateral breasts. No nipple discharge bilateral breasts. No lymphadenopathy. No lumps palpated bilateral breasts. No complaints of pain or tenderness on exam.    ? ?MM DIAG BREAST TOMO UNI LEFT ? ?Result Date: 11/20/2018 ?CLINICAL DATA:  Short-term interval follow-up of likely benign LEFT breast calcifications identified on baseline mammography. EXAM: DIGITAL DIAGNOSTIC UNILATERAL LEFT MAMMOGRAM WITH CAD AND TOMO COMPARISON:  Previous exam(s). ACR Breast Density Category b: There are scattered areas of fibroglandular density. FINDINGS: Tomosynthesis and synthesized full field CC and MLO views of the LEFT breast were obtained. Standard spot magnification CC and mediolateral views of the LEFT breast calcifications and a standard 2D full field mediolateral view of the LEFT breast were also obtained. The group of predominantly punctate calcifications in the UPPER OUTER QUADRANT at MIDDLE depth are unchanged from the January, 2020 examinations, measuring approximately 5 x 3 x 6 mm. There are no new suspicious linear or branching forms. No new or suspicious findings elsewhere in the LEFT breast. Mammographic images were processed with CAD. IMPRESSION: Stable likely  benign calcifications involving the UPPER OUTER QUADRANT of the LEFT breast at MIDDLE depth. RECOMMENDATION: Annual BILATERAL diagnostic mammography with spot magnification views of the LEFT breast calcifications in 6 months. I have discussed the findings and recommendations with the patient. Communication with the patient was achieved with the assistance of a certified interpreter. If applicable, a reminder letter will be sent to the patient regarding the next appointment. BI-RADS CATEGORY  3: Probably benign. Electronically Signed   By: Hulan Saas M.D.   On: 11/20/2018 09:25  ? ?MS DIGITAL SCREENING TOMO BILATERAL ? ?Result Date: 05/16/2018 ?CLINICAL DATA:  Screening. EXAM: DIGITAL SCREENING BILATERAL MAMMOGRAM WITH TOMO AND CAD COMPARISON:  None. ACR Breast Density Category b: There are scattered areas of fibroglandular density. FINDINGS: In the right breast a possible asymmetry requires further evaluation. In the left breast microcalcifications as well as a possible mass require further evaluation. Images were processed with CAD. IMPRESSION: Further evaluation is suggested for possible asymmetry in the right breast. Further evaluation is suggested for possible microcalcifications as well as a possible mass in the left breast. RECOMMENDATION: Diagnostic mammogram and possibly ultrasound of both breasts. (Code:FI-B-30M) The patient will be contacted regarding the findings, and additional imaging will be scheduled. BI-RADS CATEGORY  0: Incomplete. Need additional imaging evaluation and/or prior mammograms for comparison. Electronically Signed   By: Elberta Fortis M.D.   On: 05/16/2018 11:36  ? ?MS DIGITAL DIAG TOMO BILAT ? ?Result Date: 06/02/2020 ?CLINICAL DATA:  Patient for follow-up of probably benign left breast calcifications. EXAM: DIGITAL DIAGNOSTIC BILATERAL MAMMOGRAM WITH TOMOSYNTHESIS AND CAD TECHNIQUE: Bilateral digital diagnostic mammography and breast tomosynthesis was performed. Digital images of  the breasts  were evaluated with computer-aided detection. COMPARISON:  Previous exam(s). ACR Breast Density Category c: The breast tissue is heterogeneously dense, which may obscure small masses. FINDINGS: Magnification cc and true lateral views of the left breast were obtained. Grouped calcifications within the upper-outer left breast are stable. No new masses, calcifications or nonsurgical distortion identified within either breast. IMPRESSION: Stable left breast calcifications compatible with benign process given stability over time. No mammographic evidence for malignancy. RECOMMENDATION: Screening mammogram in one year.(Code:SM-B-01Y) I have discussed the findings and recommendations with the patient. If applicable, a reminder letter will be sent to the patient regarding the next appointment. BI-RADS CATEGORY  2: Benign. Electronically Signed   By: Annia Belt M.D.   On: 06/02/2020 14:32  ? ?MS DIGITAL DIAG TOMO BILAT ? ?Result Date: 05/29/2019 ?CLINICAL DATA:  Follow-up for probably benign calcifications in the LEFT breast. These calcifications were originally identified in January of 2020. EXAM: DIGITAL DIAGNOSTIC BILATERAL MAMMOGRAM WITH CAD AND TOMO COMPARISON:  Previous exam(s). ACR Breast Density Category c: The breast tissue is heterogeneously dense, which may obscure small masses. FINDINGS: The grouped calcifications within the upper-outer quadrant of the LEFT breast are stable in number and extent, the majority of which again appear to represent benign milk of calcium. There are no new dominant masses, suspicious calcifications or secondary signs of malignancy within either breast. Mammographic images were processed with CAD. IMPRESSION: 1. Stable probably benign calcifications within the upper-outer quadrant of the LEFT breast, suspected benign milk of calcium. Recommend additional follow-up diagnostic mammogram in 12 months to ensure 2 year stability. 2. No evidence of malignancy within the RIGHT  breast. RECOMMENDATION: Bilateral diagnostic mammogram, with LEFT breast magnification views, in 12 months. I have discussed the findings and recommendations with the patient. If applicable, a reminder letter will be sent to the patient regarding the next appointment. BI-RADS CATEGORY  3: Probably benign. Electronically Signed   By: Bary Richard M.D.   On: 05/29/2019 15:26  ? ?MS DIGITAL DIAG TOMO BILAT ? ?Result Date: 05/21/2018 ?CLINICAL DATA:  56 year old patient was recalled from recent baseline screening mammogram for evaluation of a possible mass in the medial left breast and calcifications in the upper-outer left breast. She was also recalled for evaluation of a possible asymmetry in the right breast. Spanish interpreter was present to give the patient results today. EXAM: DIGITAL DIAGNOSTIC BILATERAL MAMMOGRAM WITH CAD AND TOMO COMPARISON:  Baseline screening mammogram May 15, 2018 ACR Breast Density Category c: The breast tissue is heterogeneously dense, which may obscure small masses. FINDINGS: Magnification views of the upper outer left breast show a group of calcifications spanning approximately 6 mm. These are rounded and smudgy in the CC projection, and the majority of them demonstrate layering on the 90 degree lateral view, consistent with benign milk of calcium. Spot compression views of the upper inner left breast show dispersion of fibroglandular tissue, without a persistent mass. No suspicious findings in the medial left breast. Spot compression view with tomography of the slightly medial right breast in the CC projection shows dispersion of fibroglandular tissue without mass or persistent asymmetry. 90 degree lateral view of the right breast with tomography is negative. Mammographic images were processed with CAD. IMPRESSION: Probably benign calcifications in the upper-outer quadrant of the left breast. These are favored to be benign milk of calcium. No evidence of malignancy in the right  breast. RECOMMENDATION: Diagnostic left mammogram with magnification views is recommended in 6 months. I have discussed the findings and recommendations  with the patient. Results were also provided in writing at

## 2021-10-23 ENCOUNTER — Other Ambulatory Visit: Payer: No Typology Code available for payment source

## 2021-10-23 ENCOUNTER — Ambulatory Visit: Payer: No Typology Code available for payment source

## 2021-12-11 ENCOUNTER — Ambulatory Visit: Payer: No Typology Code available for payment source

## 2021-12-11 ENCOUNTER — Other Ambulatory Visit: Payer: Self-pay

## 2021-12-11 ENCOUNTER — Inpatient Hospital Stay: Payer: Self-pay | Attending: Obstetrics and Gynecology | Admitting: *Deleted

## 2021-12-11 VITALS — BP 136/88 | Ht 62.0 in | Wt 208.7 lb

## 2021-12-11 DIAGNOSIS — Z0000 Encounter for general adult medical examination without abnormal findings: Secondary | ICD-10-CM

## 2021-12-11 NOTE — Progress Notes (Signed)
Wisewoman initial screening   Interpreter- Natale Lay, [employer]   Clinical Measurement: There were no vitals filed for this visit. Fasting Labs Drawn Today, will review with patient when they result.   Medical History:  Patient states that she has high cholesterol,  does not know if she has  high blood pressure and she does not have diabetes.  Medications:  Patient states that she does not take medication to lower cholesterol, blood pressure or blood sugar.  Patient does not take an aspirin a day to help prevent a heart attack or stroke.    Blood pressure, self measurement: Patient states that she does not measure blood pressure from home. She checks her blood pressure N/A. She shares her readings with a health care provider: N/A.   Nutrition: Patient states that on average she eats 0 cups of fruit and 1 cups of vegetables per day. Patient states that she does not eat fish at least 2 times per week. Patient eats less than half servings of whole grains. Patient drinks less than 36 ounces of beverages with added sugar weekly: yes. Patient is currently watching sodium or salt intake: no. In the past 7 days patient has consumed drinks containing alcohol on 0 days. On a day that patient consumes drinks containing alcohol on average 0 drinks are consumed.      Physical activity:  Patient states that she gets 0 minutes of moderate and 0 minutes of vigorous physical activity each week.  Smoking status:  Patient states that she has has never smoked .   Quality of life:  Over the past 2 weeks patient states that she had little interest or pleasure in doing things: not at all. She has been feeling down, depressed or hopeless:not at all.    Risk reduction and counseling:   Health Coaching:  Spoke with patient about the daily recommendation for fruits and vegetables (2 cups of fruit and 3 cups of vegetables). Showed patient what a serving size looks like. Explained the importance of heart healthy fish in  weekly diet. Gave suggestions for salmon, tuna, mackerel, sardines, sea bass or trout. Patient currently consumes fish once or twice per week. Patient consumes oatmeal but no other whole grains. Gave suggestions for other whole grains such as: whole wheat bread, brown rice, whole wheat pasta and whole grain cereals. Patient has not been watching the amount of salt/sodium that she consumes. Encouraged patient to start being mindful of the amount of salt that she uses while preparing food and how much additional salt that she adds to her prepared food. Patient has not been exercising recently. Explained to patient that the daily recommendation is for 20-30 minutes daily.   Goal: Patient states that she would like to get back on track with eating healthier and exercising. Patient states that she knows that she has not been making good choices with food. Patient would ike to work on making healthier changes with food choice and would like to start exercising more regularly over the next 3 months.   Navigation:  I will notify patient of lab results.  Patient is aware of 2 more health coaching sessions and a follow up.  Time: 20 minutes

## 2021-12-12 LAB — LIPID PANEL
Chol/HDL Ratio: 5.6 ratio — ABNORMAL HIGH (ref 0.0–4.4)
Cholesterol, Total: 219 mg/dL — ABNORMAL HIGH (ref 100–199)
HDL: 39 mg/dL — ABNORMAL LOW (ref >39)
LDL Chol Calc (NIH): 141 mg/dL — ABNORMAL HIGH (ref 0–99)
Triglycerides: 218 mg/dL — ABNORMAL HIGH (ref 0–149)
VLDL Cholesterol Cal: 39 mg/dL (ref 5–40)

## 2021-12-12 LAB — HEMOGLOBIN A1C
Est. average glucose Bld gHb Est-mCnc: 126 mg/dL
Hgb A1c MFr Bld: 6 % — ABNORMAL HIGH (ref 4.8–5.6)

## 2021-12-12 LAB — GLUCOSE, RANDOM: Glucose: 128 mg/dL — ABNORMAL HIGH (ref 70–99)

## 2021-12-18 ENCOUNTER — Telehealth: Payer: Self-pay

## 2021-12-18 ENCOUNTER — Ambulatory Visit: Payer: No Typology Code available for payment source

## 2021-12-18 NOTE — Telephone Encounter (Signed)
Health coaching 2   interpreter- Natale Lay, [employer]   Labs- 219 cholesterol, 141 LDL cholesterol, 218 triglycerides, 39 HDL cholesterol, 6.0 hemoglobin A1C, 126 mean plasma glucose.  Patient understands and is aware of her lab results.   Goals-  1. Heart Healthy Diet- less fried foods, more lean proteins like chicken and fish, more fresh fruits and vegetables and more whole grains. 2. Reduce the amount of sweets and sugars consumed in both food and drinks. 3. Reduce the amount of carbs consumed. 4. Daily exercise for 20-30 minutes.    Navigation:  Patient is aware of 1 more health coaching session and a follow up. Patient is scheduled for FU with Internal Medicine on 12/25/21 @ 3:00 pm for elevated labs. Patient referred to Spanish DPP, with [facility] [place].  Time- 15 minutes

## 2022-04-14 IMAGING — MG MM DIGITAL SCREENING BILAT W/ TOMO AND CAD
8 series · 8 of 24 positions shown · non-contrast
Comparison: Previous exam(s).

CLINICAL DATA: Screening.

EXAM:
DIGITAL SCREENING BILATERAL MAMMOGRAM WITH TOMOSYNTHESIS AND CAD
TECHNIQUE: Bilateral screening digital craniocaudal and mediolateral oblique
mammograms were obtained. Bilateral screening digital breast
tomosynthesis was performed. The images were evaluated with
computer-aided detection.

[L MLO synth-2D]
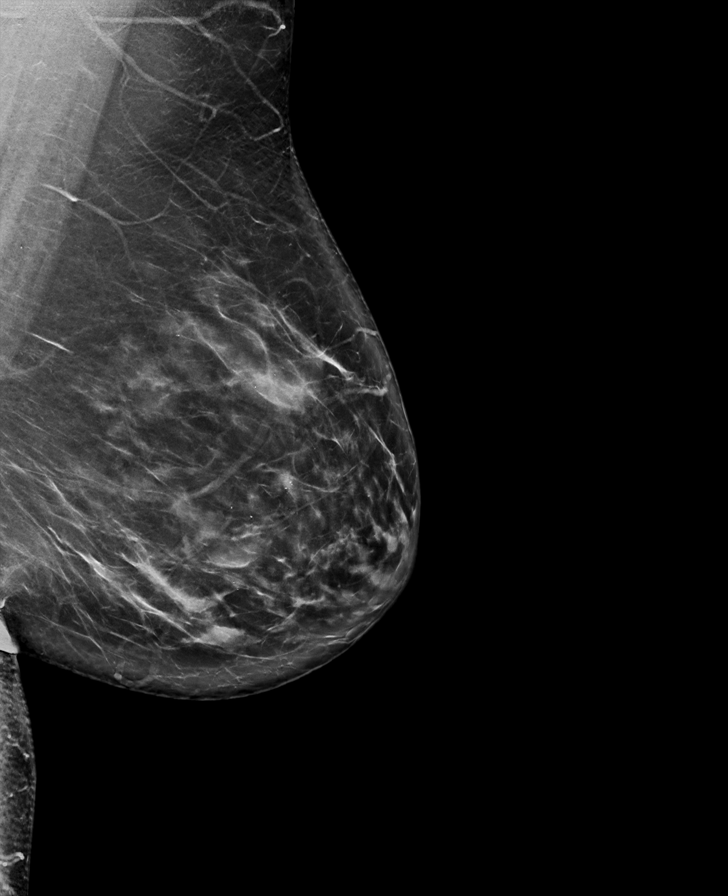

[L CC synth-2D]
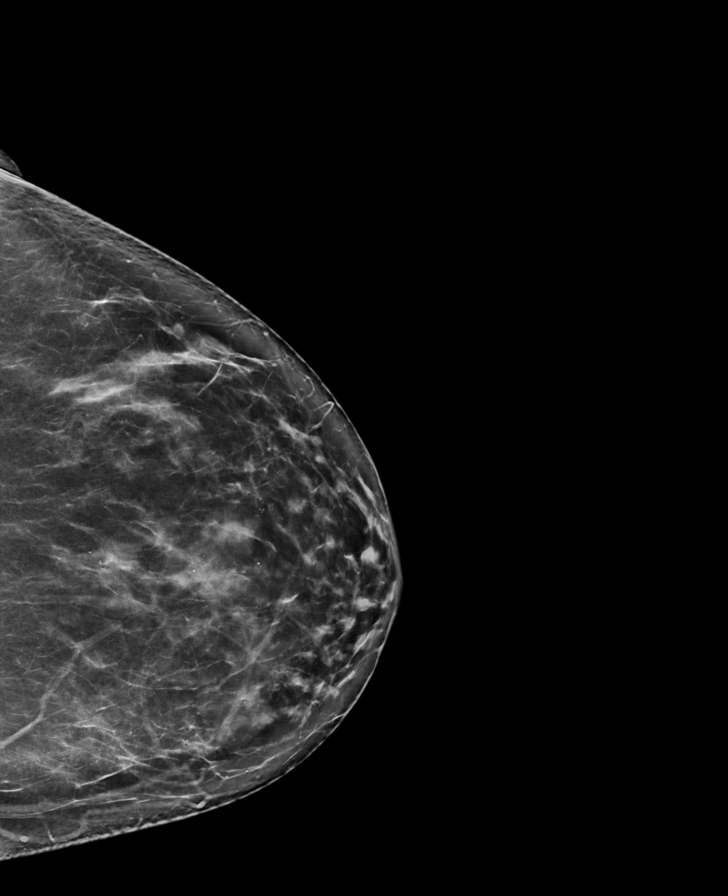

[R CC synth-2D]
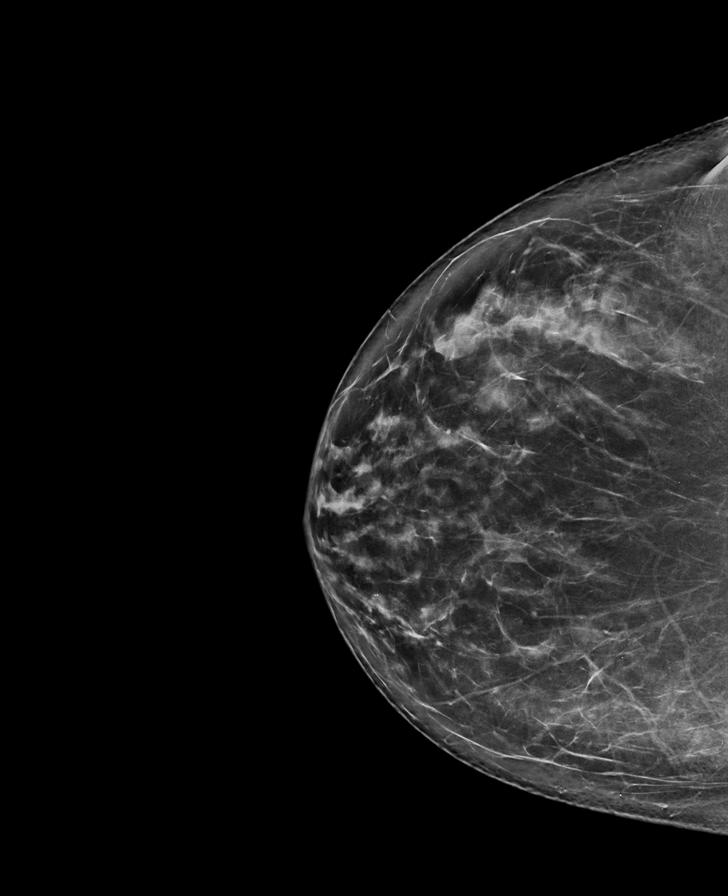

[R MLO synth-2D]
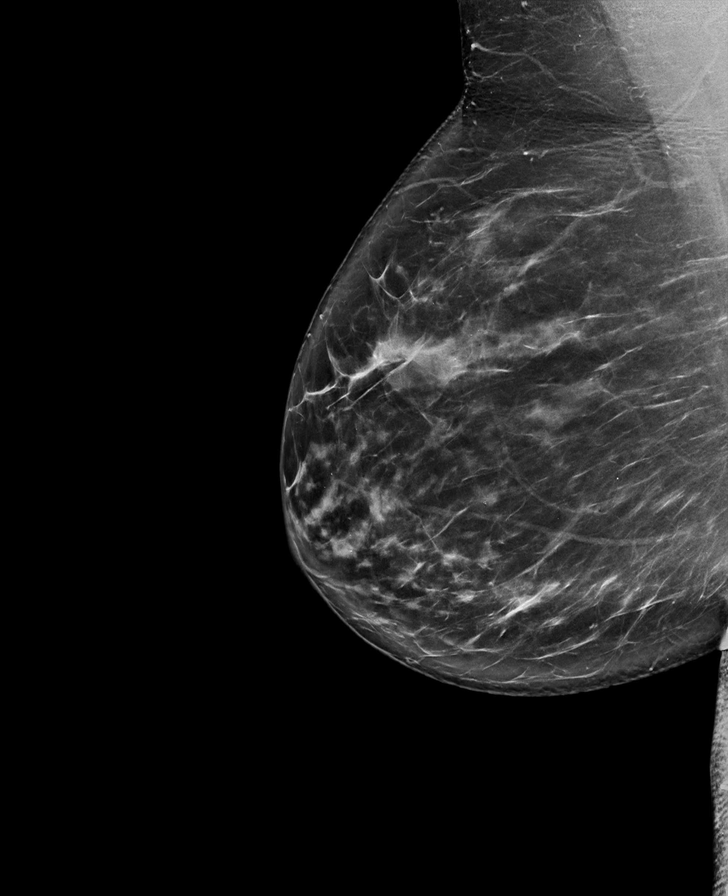

[L CC tomo · tomo slice 42/83.0]
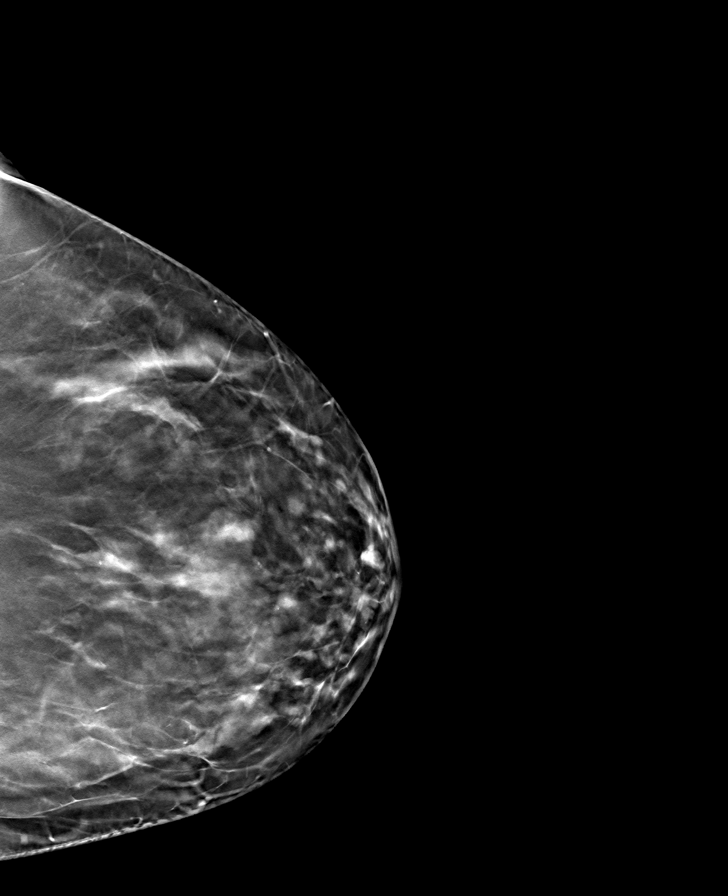

[R MLO tomo · tomo slice 47/92.0]
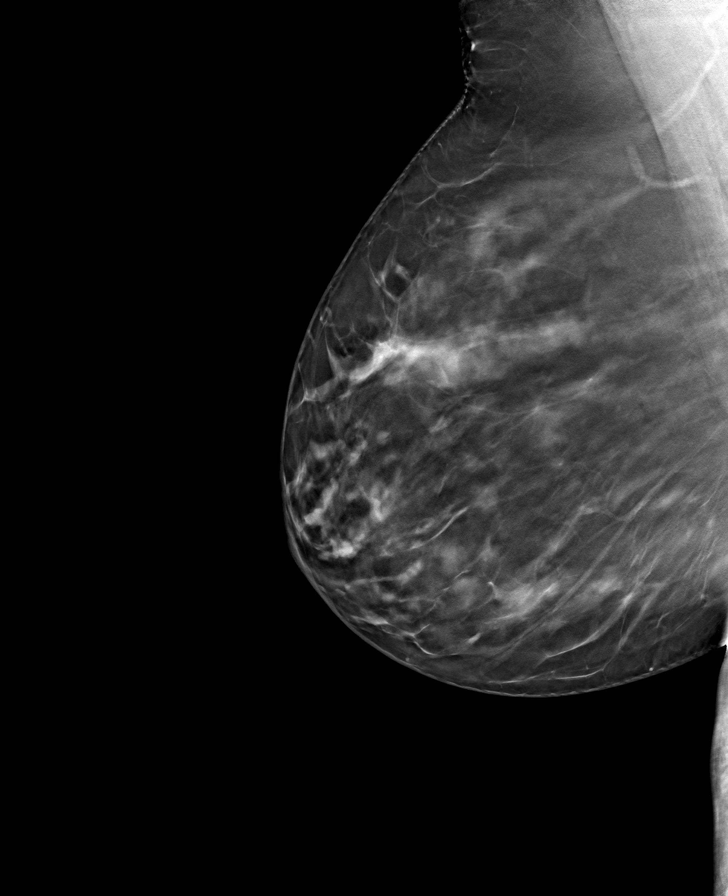

[R CC tomo · tomo slice 43/85.0]
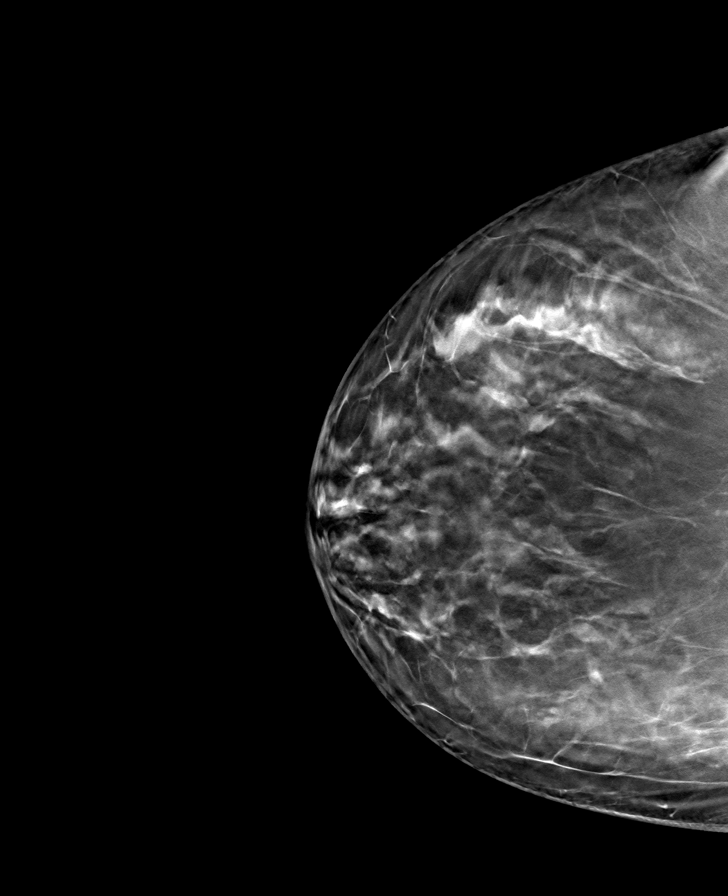

[L MLO tomo · tomo slice 47/93.0]
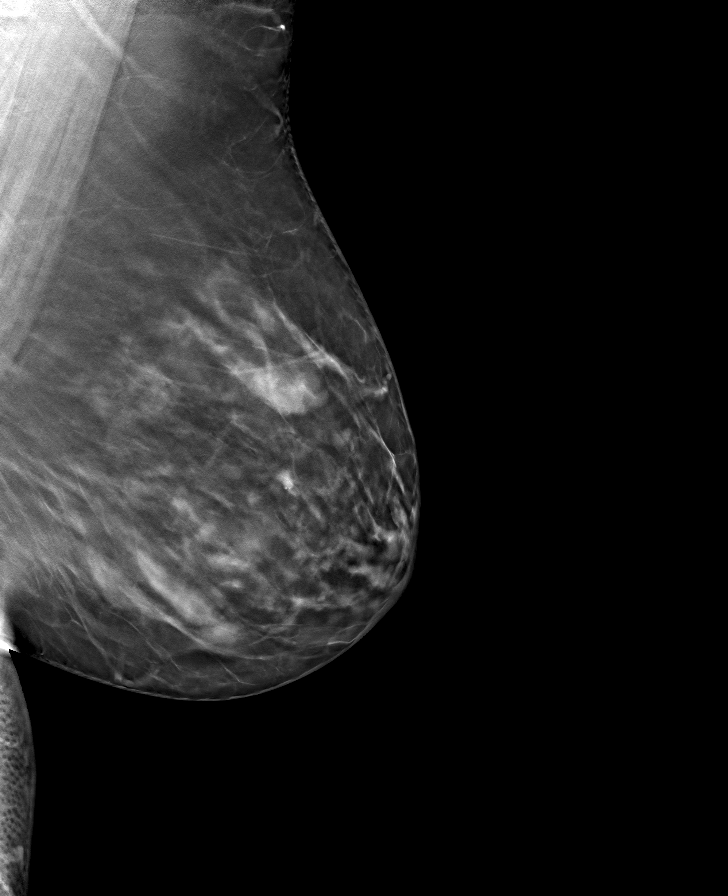

[8 of 24 positions shown; findings below may reference images not displayed]

ACR Breast Density Category b: There are scattered areas of
fibroglandular density.
FINDINGS: There are no findings suspicious for malignancy.
IMPRESSION: No mammographic evidence of malignancy. A result letter of this
screening mammogram will be mailed directly to the patient.

RECOMMENDATION:
Screening mammogram in one year. (Code:51-O-LD2)

BI-RADS CATEGORY  1: Negative.

## 2022-05-29 ENCOUNTER — Telehealth: Payer: Self-pay

## 2022-05-29 NOTE — Telephone Encounter (Signed)
Health Coaching 3- 05/28/22  interpreter- Rudene Anda, [employer]   Goals- no set goal previously   New goal- no current set goal  Barrier to reaching goal-    Strategies to overcome-   Patient states that she has made changes with her diet recently. Patient did not give specific examples. Patient also stated that she was started on medication after establishing care with new PCP.    Navigation:  Patient is aware of  a follow up session. Patient declined FU appointment. Patient is being following by PCP in [place].   Time-  10 minutes
